# Patient Record
Sex: Male | Born: 1980 | Hispanic: Yes | Marital: Single | Smoking: Never smoker
Health system: Southern US, Community
[De-identification: ages and names within clinical notes are randomized; demographics above are authoritative.]

## PROBLEM LIST (undated history)

## (undated) HISTORY — PX: BLADDER SURGERY: SHX569

---

## 2012-05-23 ENCOUNTER — Emergency Department (HOSPITAL_COMMUNITY)
Admission: EM | Admit: 2012-05-23 | Discharge: 2012-05-23 | Disposition: A | Discharge status: Home / Self Care | Payer: No Typology Code available for payment source | Attending: Emergency Medicine | Admitting: Emergency Medicine

## 2012-05-23 ENCOUNTER — Emergency Department (HOSPITAL_COMMUNITY): Payer: No Typology Code available for payment source

## 2012-05-23 ENCOUNTER — Encounter (HOSPITAL_COMMUNITY): Payer: Self-pay | Admitting: Emergency Medicine

## 2012-05-23 DIAGNOSIS — S0501XA Injury of conjunctiva and corneal abrasion without foreign body, right eye, initial encounter: Secondary | ICD-10-CM

## 2012-05-23 DIAGNOSIS — R51 Headache: Secondary | ICD-10-CM | POA: Insufficient documentation

## 2012-05-23 DIAGNOSIS — S058X9A Other injuries of unspecified eye and orbit, initial encounter: Secondary | ICD-10-CM | POA: Insufficient documentation

## 2012-05-23 DIAGNOSIS — Y92410 Unspecified street and highway as the place of occurrence of the external cause: Secondary | ICD-10-CM | POA: Insufficient documentation

## 2012-05-23 DIAGNOSIS — Y9389 Activity, other specified: Secondary | ICD-10-CM | POA: Insufficient documentation

## 2012-05-23 DIAGNOSIS — Z23 Encounter for immunization: Secondary | ICD-10-CM | POA: Insufficient documentation

## 2012-05-23 MED ORDER — HYDROCODONE-ACETAMINOPHEN 5-325 MG PO TABS
1.0000 | ORAL_TABLET | Freq: Once | ORAL | Status: AC
  Administered 2012-05-23: 1 via ORAL
  Filled 2012-05-23: qty 1

## 2012-05-23 MED ORDER — CYCLOBENZAPRINE HCL 10 MG PO TABS: 10.0000 mg | ORAL_TABLET | Freq: Two times a day (BID) | ORAL | Status: DC | PRN

## 2012-05-23 MED ORDER — TETANUS-DIPHTH-ACELL PERTUSSIS 5-2.5-18.5 LF-MCG/0.5 IM SUSP
0.5000 mL | Freq: Once | INTRAMUSCULAR | Status: AC
  Administered 2012-05-23: 0.5 mL via INTRAMUSCULAR
  Filled 2012-05-23: qty 0.5

## 2012-05-23 MED ORDER — TETRACAINE HCL 0.5 % OP SOLN
1.0000 [drp] | Freq: Once | OPHTHALMIC | Status: DC
  Filled 2012-05-23: qty 2

## 2012-05-23 MED ORDER — ERYTHROMYCIN 5 MG/GM OP OINT: TOPICAL_OINTMENT | OPHTHALMIC | Status: DC

## 2012-05-23 MED ORDER — FLUORESCEIN SODIUM 1 MG OP STRP
1.0000 | ORAL_STRIP | Freq: Once | OPHTHALMIC | Status: DC
  Filled 2012-05-23 (×2): qty 1
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
## 2012-05-23 NOTE — ED Notes (Signed)
Patient transported to CT 
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
## 2015-01-26 ENCOUNTER — Encounter: Payer: Self-pay | Admitting: Emergency Medicine

## 2015-01-26 ENCOUNTER — Emergency Department
Admission: EM | Admit: 2015-01-26 | Discharge: 2015-01-26 | Disposition: A | Discharge status: Home / Self Care | Payer: No Typology Code available for payment source | Attending: Emergency Medicine | Admitting: Emergency Medicine

## 2015-01-26 ENCOUNTER — Emergency Department: Payer: No Typology Code available for payment source

## 2015-01-26 DIAGNOSIS — Y998 Other external cause status: Secondary | ICD-10-CM | POA: Insufficient documentation

## 2015-01-26 DIAGNOSIS — Y9389 Activity, other specified: Secondary | ICD-10-CM | POA: Insufficient documentation

## 2015-01-26 DIAGNOSIS — S39012A Strain of muscle, fascia and tendon of lower back, initial encounter: Secondary | ICD-10-CM | POA: Insufficient documentation

## 2015-01-26 DIAGNOSIS — Y92410 Unspecified street and highway as the place of occurrence of the external cause: Secondary | ICD-10-CM | POA: Diagnosis not present

## 2015-01-26 DIAGNOSIS — S7012XA Contusion of left thigh, initial encounter: Secondary | ICD-10-CM | POA: Insufficient documentation

## 2015-01-26 DIAGNOSIS — S161XXA Strain of muscle, fascia and tendon at neck level, initial encounter: Secondary | ICD-10-CM | POA: Insufficient documentation

## 2015-01-26 DIAGNOSIS — S7002XA Contusion of left hip, initial encounter: Secondary | ICD-10-CM | POA: Diagnosis not present

## 2015-01-26 DIAGNOSIS — S199XXA Unspecified injury of neck, initial encounter: Secondary | ICD-10-CM | POA: Diagnosis present

## 2015-01-26 MED ORDER — NAFTIFINE HCL 2 % EX CREA: 1.0000 "application " | TOPICAL_CREAM | Freq: Two times a day (BID) | CUTANEOUS | Status: AC

## 2015-01-26 MED ORDER — CYCLOBENZAPRINE HCL 10 MG PO TABS: 10.0000 mg | ORAL_TABLET | Freq: Three times a day (TID) | ORAL | Status: AC | PRN

## 2015-01-26 NOTE — ED Notes (Signed)
Assess per PA 
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
## 2015-01-26 NOTE — ED Notes (Signed)
Medical interpreter was present for discharge instructions and discharge.

## 2015-01-26 NOTE — Discharge Instructions (Signed)
Distensin cervical (Cervical Sprain) La distensin cervical se produce cuando los tejidos (ligamentos) del cuello se estiran o se rompen. CUIDADOS EN EL HOGAR   Aplique hielo sobre la zona lesionada.  Ponga el hielo en una bolsa plstica.  Colquese una toalla entre la piel y la bolsa de hielo.  Deje el hielo durante 15 - 20 minutos y aplquelo 3 - 4 veces por Futures trader.  Es posible que le hayan indicado el uso de un collarn. Este collarn impide que el cuello se mueva mientras se [place].  No se lo quite excepto que se lo indique el mdico.  Si tiene el cabello largo, mantngalo fuera del collarn.  Consulte a su mdico antes de cambiar la posicin del collarn. Es posible que necesite cambiar la posicin con el tiempo para sentirse ms cmodo.  Si le permiten quitarse el collarn para lavarlo o darse un bao, siga las indicaciones de su mdico acerca de cmo hacerlo con seguridad.  Mantenga el collarn limpio pasando un pao con agua y [place]. Squelo bien. Si el collarn tiene almohadillas removibles, qutelas cada 1-2 das para lavarlas a mano con agua y [place]. Deje que se sequen al aire. Debe secarlas bien antes de volver a colocarlas en el collarn.  No conduzca vehculos mientras [place] el collarn.  Slo tome los medicamentos que le haya indicado su mdico.  Cumpla con las visitas al mdico segn las indicaciones.  Cumpla con las sesiones de fisioterapia, segn las indicaciones.  Ajuste su mesa de trabajo de modo que tenga una buena postura al trabajar.  Evite las posiciones y actividades que [employer] sntomas.  Haga un precalentamiento y elongacin adecuados antes de la [place]. SOLICITE AYUDA SI:  El dolor no se alivia con los [employer].  No puede disminuir las dosis de medicamentos para el dolor luego de un tiempo, segn lo planificado.  Su nivel de actividad no mejora segn lo esperado. SOLICITE AYUDA DE INMEDIATO SI:   Tiene sangrado.  Siente Restaurant manager, fast food.  Tiene alguna reaccin a los medicamentos.  Tiene sntomas nuevos que no puede explicar.  Pierde la sensibilidad (adormecimiento) o no Therapist, nutritional del cuerpo (parlisis).  Siente hormigueo o debilidad en alguna parte del cuerpo.  Los sntomas empeoran. Los sntomas son:  Dolor, sensibilidad, rigidez, inflamacin(hinchazn), o sensacin de ardor en el cuello.  Siente dolor cuando le tocan el cuello.  Dolor en el hombro o la zona superior de la espalda.  Capacidad limitada para mover el cuello.  Dolor de [place].  Mareos.  Debilidad, falta de sensibilidad o sensacin hormigueos en las manos o los brazos.  Espasmos musculares.  Dificultades para tragar o comer. ASEGRESE DE QUE:   Comprende estas instrucciones.  Controlar su afeccin.  Recibir ayuda de inmediato si no mejora o si empeora.   Esta informacin no tiene Theme park manager el consejo del mdico. Asegrese de hacerle al mdico cualquier pregunta que tenga.   Document Released: 01/09/2011 Document Revised: 09/22/2012 Elsevier Interactive Patient Education 2016 [employer].  Contusin en la cresta ilaca  (Iliac Crest Contusion) Una contusin en la cresta ilaca es un hematoma profundo en el (hueso de la cadera). Las contusiones son el resultado de una lesin que causa sangrado debajo de la piel. La zona de la contusin puede ponerse [place], [place] o [place]. Las lesiones menores no causan Engineer, mining, Biomedical engineer las ms graves pueden presentar dolor e inflamacin durante un par de semanas.  CAUSAS  Una contusin en la cresta ilaca  es consecuencia de un golpe en la parte superior del hueso de la cadera. Las lesiones con frecuencia son consecuencia de los deportes de [place].  SNTOMAS   Hinchazn y enrojecimiento en la zona de la cadera.  Hematoma en la zona.  Sensibilidad o dolor. DIAGNSTICO  El diagnstico puede hacerse realizando una historia clnica y un examen fsico. Puede ser  necesario que le tomen una radiografa del hueso de la cadera para diagnosticar si hay algn hueso roto (fractura).  TRATAMIENTO  El mejor tratamiento para la contusin en la cresta ilaca suele ser el reposo, la aplicacin de hielo, la elevacin de la zona y la aplicacin de compresas fras en la zona de la lesin. Para controlar el dolor tambin podrn recetarle medicamentos de [place]. Se pueden usar muletas si siente mucho dolor al caminar. Algunas personas necesitan fisioterapia para ayudar con la amplitud de movimientos y elongacin.  INSTRUCCIONES PARA EL CUIDADO EN EL HOGAR   Aplique hielo sobre la zona lesionada.  Ponga el hielo en una bolsa plstica.  Colquese una toalla entre la piel y la bolsa de hielo.  Deje el hielo durante 15 a 20 minutos, 3 a 4 veces por da.  Slo tome medicamentos de venta libre o recetados para Primary school teacher, las molestias o bajar la fiebre segn las indicaciones de su mdico. El mdico podr indicarle que evite tomar antiinflamatorios (aspirina, ibuprofeno y naproxeno) durante 48 horas ya que estos medicamentos pueden aumentar los hematomas.  Mantenga la pierna derecha (extendida) siempre que le sea posible.  Camine o muvase en la medida que el dolor se lo permita o segn se lo hayan indicado. Use muletas si el mdico se lo indica.  Aplique un vendaje de compresin segn le haya indicado su mdico. Puede retirar los vendajes para dormir, darse Neomia Dear ducha y baarse. SOLICITE ATENCIN MDICA DE INMEDIATO SI:   El hematoma o la hinchazn aumentan.  Siente dolor que [place].  La hinchazn o el dolor no se [employer].  Los pies se enfran. ASEGRESE DE QUE:   Comprende estas instrucciones.  Controlar su enfermedad.  Solicitar ayuda de inmediato si no mejora o si empeora.   Esta informacin no tiene Theme park manager el consejo del mdico. Asegrese de hacerle al mdico cualquier pregunta que tenga.   Document  Released: 01/20/2005 Document Revised: 04/14/2011 Elsevier Interactive Patient Education 2016 [employer].  Colisin con un vehculo de motor (Tourist information centre manager) Despus de sufrir un accidente automovilstico, es normal tener diversos hematomas y [employer]. Generalmente, estas molestias son peores durante las primeras 24 horas. En las primeras horas, probablemente sienta mayor entumecimiento y Engineer, mining. Tambin puede sentirse peor al despertarse la maana posterior a la colisin. A partir de all, debera comenzar a Associate Professor. La velocidad con que se mejora generalmente depende de la gravedad de la colisin y la cantidad, [place] y Firefighter de las lesiones. INSTRUCCIONES PARA EL CUIDADO EN EL HOGAR   Aplique hielo sobre la zona lesionada.  Ponga el hielo en una bolsa plstica.  Colquese una toalla entre la piel y la bolsa de hielo.  Deje el hielo durante 15 a , 3 a 4veces por da, o segn las indicaciones del mdico.  Albesa Seen suficiente lquido para mantener la orina clara o de color amarillo plido. No beba alcohol.  Tome una ducha o un bao tibio una o dos veces al da. Esto aumentar el flujo de [employer] msculos doloridos.  Puede retomar  sus actividades normales cuando se lo indique el mdico. Tenga cuidado al levantar objetos, ya que puede agravar el dolor en el cuello o en la espalda.  Utilice los medicamentos de venta libre o recetados para Primary school teacher, el malestar o la fiebre, segn se lo indique el mdico. No tome aspirina. Puede aumentar los hematomas o la hemorragia. SOLICITE ATENCIN MDICA DE INMEDIATO SI:  Tiene entumecimiento, hormigueo o debilidad en los brazos o las piernas.  Tiene dolor de cabeza intenso que no mejora con medicamentos.  Siente un dolor intenso en el cuello, especialmente con la palpacin en el centro de la espalda o el cuello.  Disminuye su control de la vejiga o los intestinos.  Aumenta el dolor en  cualquier parte del cuerpo.  Le falta el aire, tiene sensacin de desvanecimiento, mareos o [employer].  Siente dolor en el pecho.  Tiene malestar estomacal (nuseas), vmitos o sudoracin.  Cada vez siente ms dolor abdominal.  Anola Gurney sangre en la orina, en la materia fecal o en el vmito.  Siente dolor en los hombros (en la zona del cinturn de seguridad).  Siente que los sntomas empeoran. ASEGRESE DE QUE:   Comprende estas instrucciones.  Controlar su afeccin.  Recibir ayuda de inmediato si no mejora o si empeora.   Esta informacin no tiene Theme park manager el consejo del mdico. Asegrese de hacerle al mdico cualquier pregunta que tenga.   Document Released: 10/30/2004 Document Revised: 02/10/2014 Elsevier Interactive Patient Education 2016 [employer].  Distensin lumbosacra (Lumbosacral Strain) La distensin lumbosacra es una distensin de cualquiera de las partes que componen las vrtebras lumbosacras. Las vrtebras lumbosacras son los huesos que conforman el tercio inferior de la columna vertebral. Estas vrtebras estn sostenidas por msculos y un resistente tejido fibroso (ligamentos).  CAUSAS  Un golpe repentino en la espalda puede provocar una distensin lumbosacra. Adems, cualquier tipo de movimiento que cause una elongacin excesiva de los msculos de la zona lumbar puede provocar este tipo de distensin. Esto se ve normalmente en las personas que se esfuerzan demasiado, se caen, levantan objetos pesados, se agachan o estn en cuclillas con regularidad. FACTORES DE RIESGO  Trabajo agotador.  Participar en deportes en los cuales se deba empujar o tirar y que requieren de un giro repentino de la espalda (tenis, golf, bisbol).  Levantar peso.  Curvatura excesiva de la zona lumbar.  Pelvis hacia adelante.  Espalda o msculos abdominales dbiles, o ambos.  Tendones isquiotibiales tensos. SIGNOS Y SNTOMAS  La distensin lumbosacra puede provocar  dolor en la zona de la lesin o un dolor que baja (se extiende) hasta la pierna.  DIAGNSTICO Con frecuencia, el mdico puede diagnosticar una distensin [employer] un examen fsico. En algunos casos, es posible que deba realizarse pruebas, como una [place].  TRATAMIENTO  El tratamiento para la lesin lumbar depende de muchos factores que el mdico Paediatric nurse. Sin embargo, la [employer] de los tratamientos incluye el uso de antiinflamatorios. INSTRUCCIONES PARA EL CUIDADO EN EL HOGAR   Evite actividades fsicas difciles (tenis, raquetbol, esqu acutico) si no tiene un buen estado fsico para practicarlas. Esto puede Radio producer.  Si tiene un problema en la espalda, evite los deportes que requieren de movimientos corporales bruscos. La natacin y las caminatas son las actividades ms seguras.  Mantenga una buena postura.  Mantenga un peso saludable.  En el caso de episodios agudos, puede colocar hielo en la zona lesionada.  Ponga el hielo en una bolsa plstica.  Coloque  una toalla entre la piel y la bolsa de hielo.  Deje el hielo durante 20 minutos, 2 a 3 veces por da.  Cuando la zona lumbar comience a sanar, es posible que le recomienden ejercicios de elongacin y fortalecimiento. SOLICITE ATENCIN MDICA SI:  El dolor de Oncologistespalda empeora.  Tiene un dolor de espalda intenso que no mejora con medicamentos. SOLICITE ATENCIN MDICA DE INMEDIATO SI:   Siente entumecimiento, hormigueo, debilidad o problemas con el uso de los brazos o las piernas.  Nota cambios en el control de la vejiga o el intestino.  Siente un aumento del Cytogeneticistdolor en cualquier parte del cuerpo, incluido el vientre (abdomen).  Nota que le falta el aire, se siente mareado o se desmaya.  Tiene Programme researcher, broadcasting/film/videomalestar estomacal (nuseas), vomita o comienza a sudar.  Nota un cambio de color en los dedos del pie o las piernas, o los pies se ponen muy fros. ASEGRESE DE QUE:   Comprende estas  instrucciones.  Controlar su afeccin.  Recibir ayuda de inmediato si no mejora o si empeora.   Esta informacin no tiene Theme park managercomo fin reemplazar el consejo del mdico. Asegrese de hacerle al mdico cualquier pregunta que tenga.   Document Released: 10/30/2004 Document Revised: 01/25/2013 Elsevier Interactive Patient Education [employer]2016 Elsevier Inc.

## 2015-02-21 ENCOUNTER — Other Ambulatory Visit: Payer: Self-pay | Admitting: Orthopedic Surgery

## 2015-02-21 DIAGNOSIS — M532X8 Spinal instabilities, sacral and sacrococcygeal region: Secondary | ICD-10-CM

## 2015-03-08 ENCOUNTER — Other Ambulatory Visit: Payer: Self-pay

## 2015-03-08 ENCOUNTER — Ambulatory Visit
Admission: RE | Admit: 2015-03-08 | Discharge: 2015-03-08 | Disposition: A | Discharge status: Home / Self Care | Payer: No Typology Code available for payment source | Source: Ambulatory Visit | Attending: Orthopedic Surgery | Admitting: Orthopedic Surgery

## 2015-03-08 DIAGNOSIS — M532X8 Spinal instabilities, sacral and sacrococcygeal region: Secondary | ICD-10-CM

## 2016-11-28 IMAGING — CT CT BIOPSY
1 of 3 series · 12 of 32 positions shown, 18 images · non-contrast
Comparison: none

CLINICAL DATA: Low back pain extending to the posterior left pelvis
and left lower extremity. An anesthetic injection is requested for
diagnostic purposes.

[Series 2: si joint injection · axial · 0.66mm/px · z∈[+879,+918]mm · 12 of 17 slices shown, 18 images]
[im 2/17  soft-tissue]
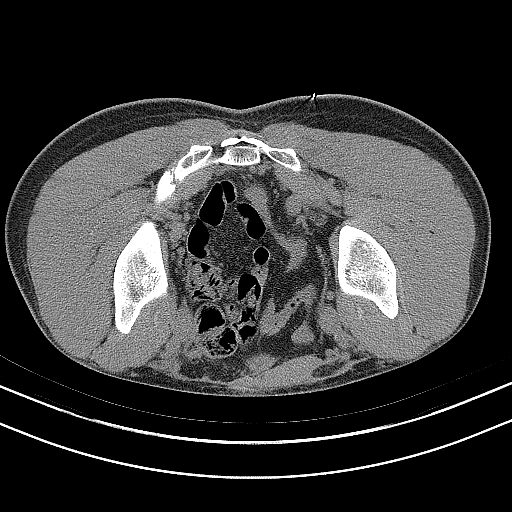
[im 2/17  bone]
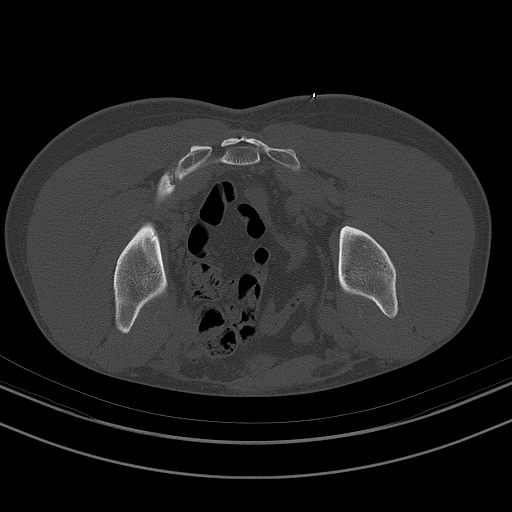
[im 3/17  soft-tissue]
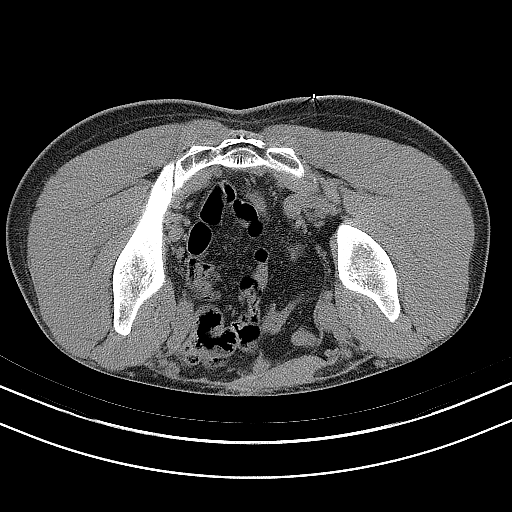
[im 4/17  soft-tissue]
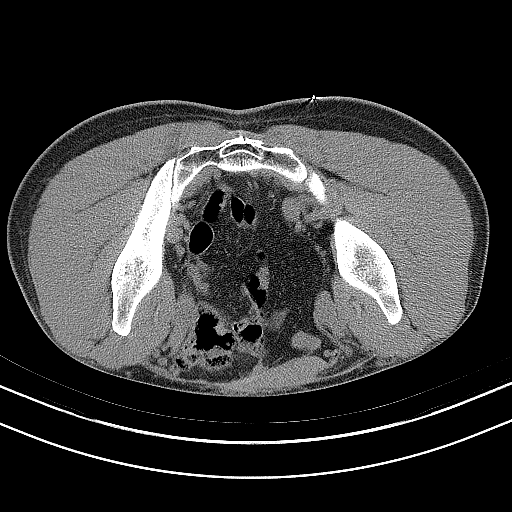
[im 5/17  soft-tissue]
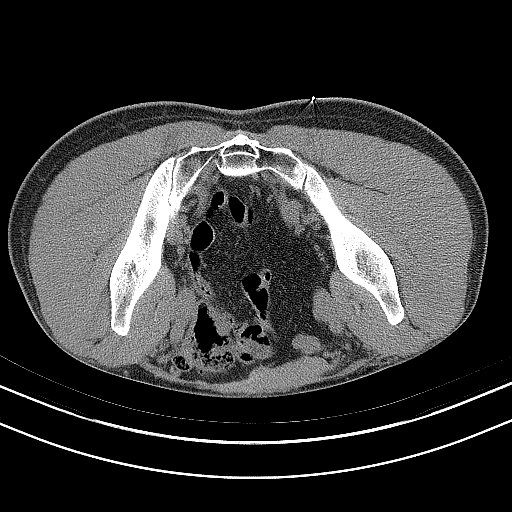
[im 7/17  soft-tissue]
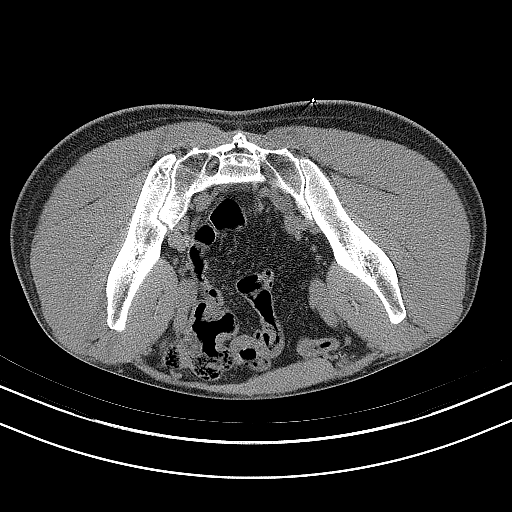
[im 8/17  soft-tissue]
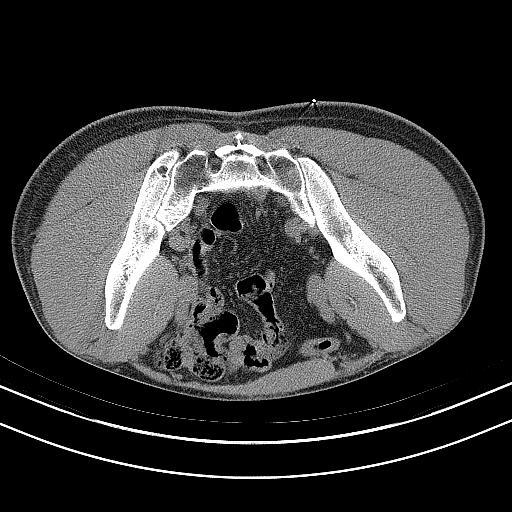
[im 9/17  soft-tissue]
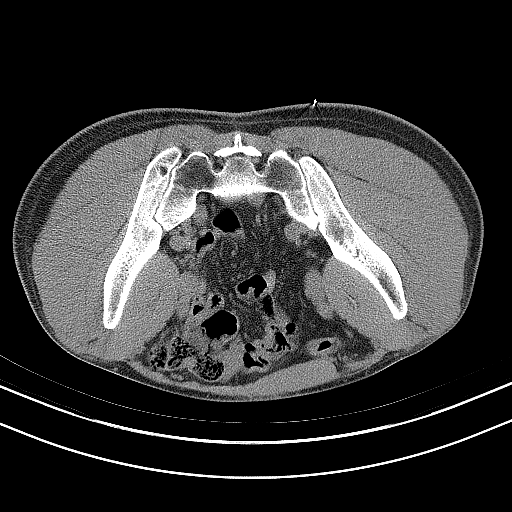
[im 10/17  soft-tissue]
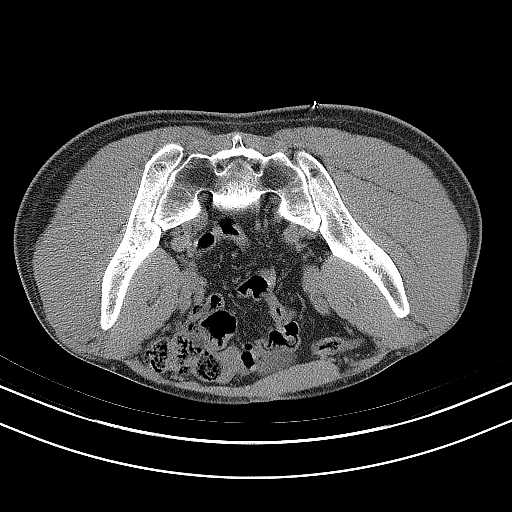
[im 12/17  soft-tissue]
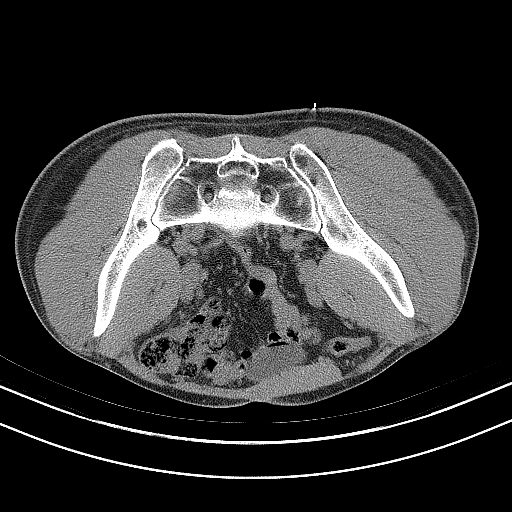
[im 12/17  lung]
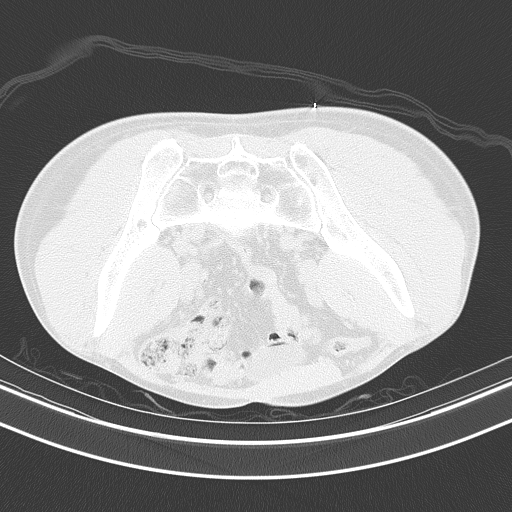
[im 12/17  bone]
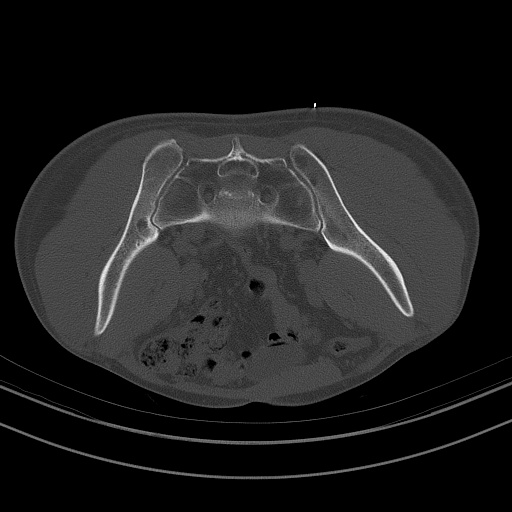
[im 13/17  soft-tissue]
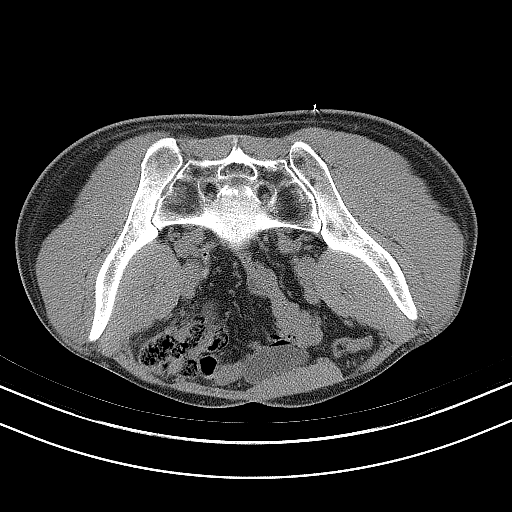
[im 13/17  lung]
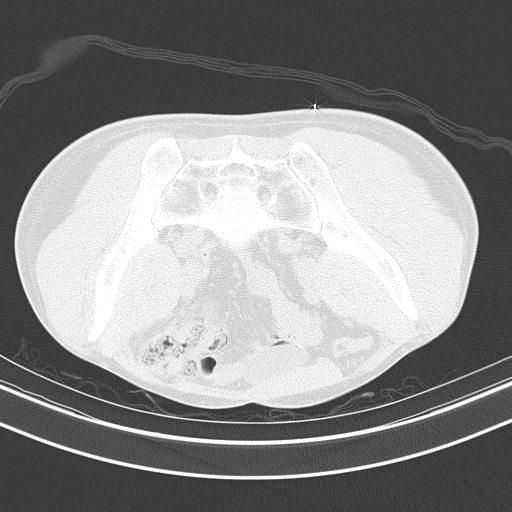
[im 14/17  soft-tissue]
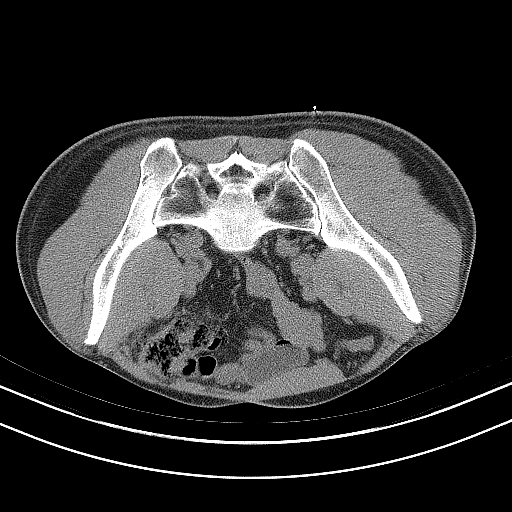
[im 14/17  lung]
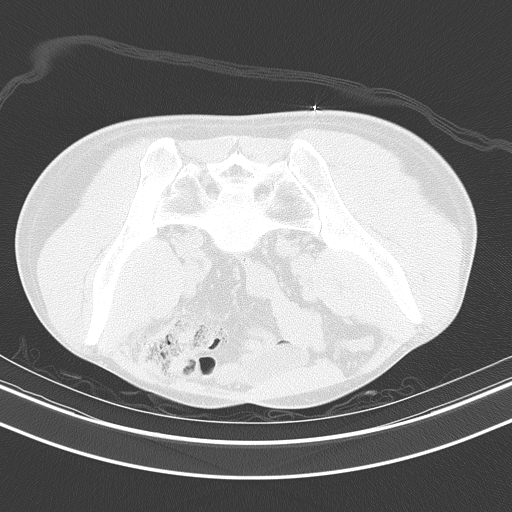
[im 15/17  soft-tissue]
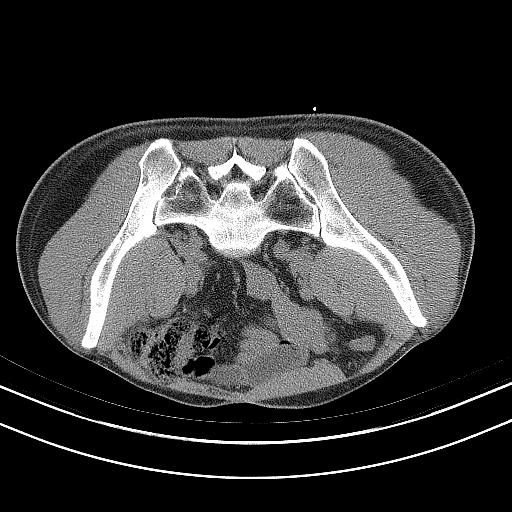
[im 15/17  lung]
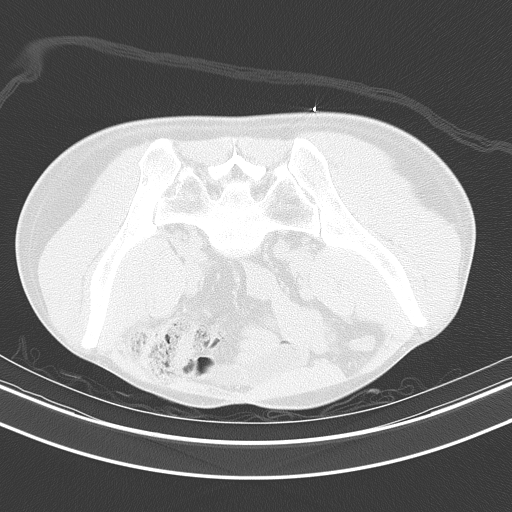

[12 of 32 positions shown; findings below may reference images not displayed]

EXAM:
Left CT GUIDED SI JOINT INJECTION



After local anesthesia with 1% lidocaine without epinephrine and
subsequent deep anesthesia, a 22 gauge spinal needle was advanced
into the left SI joint under intermittent CT guidance.

Once the needle was in satisfactory position, representative image
was captured with the needle demonstrated in the sacroiliac joint.
Subsequently, 3 mL 1% lidocaine was injected into the left SI joint.
Needles removed and a sterile dressing applied.

Bilateral sub cm well-defined hypodense lesions are present within
the iliac bones. These do not transgress the cortex. There is no
osseous destruction. The sclerotic lesions are present.

No complications were observed.
IMPRESSION: IMPRESSION

Successful CT-guided left SI joint injection with anesthetic only.

Bilateral sub cm hypodense lesions in the iliac bones compatible
with benign lesions such as hemangiomas. No follow-up is necessary.

## 2021-06-01 ENCOUNTER — Encounter: Payer: Self-pay | Admitting: Emergency Medicine

## 2021-06-01 ENCOUNTER — Emergency Department
Admission: EM | Admit: 2021-06-01 | Discharge: 2021-06-01 | Disposition: A | Discharge status: Home / Self Care | Payer: Self-pay | Attending: Emergency Medicine | Admitting: Emergency Medicine

## 2021-06-01 ENCOUNTER — Other Ambulatory Visit: Payer: Self-pay

## 2021-06-01 ENCOUNTER — Emergency Department: Payer: Self-pay

## 2021-06-01 DIAGNOSIS — R1032 Left lower quadrant pain: Secondary | ICD-10-CM | POA: Insufficient documentation

## 2021-06-01 DIAGNOSIS — Z85028 Personal history of other malignant neoplasm of stomach: Secondary | ICD-10-CM | POA: Insufficient documentation

## 2021-06-01 DIAGNOSIS — R110 Nausea: Secondary | ICD-10-CM | POA: Insufficient documentation

## 2021-06-01 DIAGNOSIS — R1013 Epigastric pain: Secondary | ICD-10-CM | POA: Insufficient documentation

## 2021-06-01 DIAGNOSIS — Z85038 Personal history of other malignant neoplasm of large intestine: Secondary | ICD-10-CM | POA: Insufficient documentation

## 2021-06-01 LAB — COMPREHENSIVE METABOLIC PANEL
ALT: 18 U/L (ref 0–44)
AST: 25 U/L (ref 15–41)
Albumin: 4.5 g/dL (ref 3.5–5.0)
Alkaline Phosphatase: 54 U/L (ref 38–126)
Anion gap: 7 (ref 5–15)
BUN: 16 mg/dL (ref 6–20)
CO2: 31 mmol/L (ref 22–32)
Calcium: 9.5 mg/dL (ref 8.9–10.3)
Chloride: 101 mmol/L (ref 98–111)
Creatinine, Ser: 0.96 mg/dL (ref 0.61–1.24)
GFR, Estimated: >60 mL/min (ref >60)
Glucose, Bld: 103 mg/dL — ABNORMAL HIGH (ref 70–99)
Potassium: 3.8 mmol/L (ref 3.5–5.1)
Sodium: 139 mmol/L (ref 135–145)
Total Bilirubin: 1.6 mg/dL — ABNORMAL HIGH (ref 0.3–1.2)
Total Protein: 7.7 g/dL (ref 6.5–8.1)

## 2021-06-01 LAB — CBC
HCT: 45.1 % (ref 39.0–52.0)
Hemoglobin: 15.1 g/dL (ref 13.0–17.0)
MCH: 30.1 pg (ref 26.0–34.0)
MCHC: 33.5 g/dL (ref 30.0–36.0)
MCV: 90 fL (ref 80.0–100.0)
Platelets: 255 10*3/uL (ref 150–400)
RBC: 5.01 MIL/uL (ref 4.22–5.81)
RDW: 12.6 % (ref 11.5–15.5)
WBC: 4.6 10*3/uL (ref 4.0–10.5)
nRBC: 0 % (ref 0.0–0.2)

## 2021-06-01 LAB — URINALYSIS, ROUTINE W REFLEX MICROSCOPIC
Bilirubin Urine: NEGATIVE
Glucose, UA: NEGATIVE mg/dL
Hgb urine dipstick: NEGATIVE
Ketones, ur: NEGATIVE mg/dL
Leukocytes,Ua: NEGATIVE
Nitrite: NEGATIVE
Protein, ur: NEGATIVE mg/dL
Specific Gravity, Urine: 1.015 (ref 1.005–1.030)
pH: 6 (ref 5.0–8.0)

## 2021-06-01 LAB — LIPASE, BLOOD: Lipase: 35 U/L (ref 11–51)

## 2021-06-01 MED ORDER — OMEPRAZOLE MAGNESIUM 20 MG PO TBEC: 20.0000 mg | DELAYED_RELEASE_TABLET | Freq: Every day | ORAL | 1 refills | Status: AC

## 2021-06-01 MED ORDER — SUCRALFATE 1 G PO TABS: 1.0000 g | ORAL_TABLET | Freq: Three times a day (TID) | ORAL | 0 refills | Status: AC

## 2021-06-01 MED ORDER — IOHEXOL 300 MG/ML  SOLN
80.0000 mL | Freq: Once | INTRAMUSCULAR | Status: AC | PRN
  Administered 2021-06-01: 80 mL via INTRAVENOUS
  Filled 2021-06-01: qty 80

## 2021-06-01 NOTE — ED Triage Notes (Signed)
Pt via POV from home. Pt c/o generalized abd pain and lower back pain that started 2 months ago. Pt states it is all over, including lower and epigastric. Pt endorses nausea for 2 weeks. Denies VD. Denies fever. Pain is worse when he eating. Denies urinary symptoms. Pt is A&Ox4 and NAD ? ?Spanish interpreter needed  ?

## 2021-06-01 NOTE — ED Notes (Signed)
See triage note  presents with generalized abd pain  states pain started 2 months ago  states pain is mainly to lower abd and back  unsure of fever   afebrile on arrival ?

## 2021-06-01 NOTE — ED Provider Notes (Signed)
? ?[facility] ?Provider Note ? ? Event Date/Time  ? First MD Initiated Contact with Patient 06/01/21 1427   ?  (approximate) ?History  ?Abdominal Pain ? ?HPI ?Jeremy Hodge is a 41 y.o. male with stated past medical history of right inguinal hernia repair in 2017 who presents for abdominal pain.  Patient describes intermittent midepigastric and left lower quadrant abdominal pain that is somewhat related to p.o. intake.  Patient states that after eating he normally gets a burning sensation to the mid epigastric region that then starts a left lower quadrant abdominal pain that radiates around to the suprapubic region as well as around to his lower back.  Patient states that he also has similar symptoms when he feels like his bladder is distended.  Patient states that his pain does not change in relation to what food he eats, what time of day it is, or what medications he is taking at the time including attempted course of Bentyl which did not improve his symptoms.  Patient does state that he has a significant medical history of gastric and colon cancers which he is worried that he needs an endoscopy.  Patient currently denies any vision changes, tinnitus, difficulty speaking, facial droop, sore throat, chest pain, shortness of breath, nausea/vomiting/diarrhea, dysuria, or weakness/numbness/paresthesias in any extremity ?Physical Exam  ?Triage Vital Signs: ?ED Triage Vitals  ?Enc Vitals Group  ?   BP 06/01/21 1402 (!) 147/92  ?   Pulse Rate 06/01/21 1402 69  ?   Resp 06/01/21 1402 18  ?   Temp 06/01/21 1402 98.4 ?F (36.9 ?C)  ?   Temp Source 06/01/21 1402 Oral  ?   SpO2 06/01/21 1402 97 %  ?   Weight 06/01/21 1405 160 lb (72.6 kg)  ?   Height 06/01/21 1405 5\' 9"  (1.753 m)  ?   Head Circumference --   ?   Peak Flow --   ?   Pain Score 06/01/21 1403 5  ?   Pain Loc --   ?   Pain Edu? --   ?   Excl. in GC? --   ? ?Most recent vital signs: ?Vitals:  ? 06/01/21 1402  ?BP: (!) 147/92  ?Pulse: 69  ?Resp:  18  ?Temp: 98.4 ?F (36.9 ?C)  ?SpO2: 97%  ? ?General: Awake, oriented x4. ?CV:  Good peripheral perfusion.  ?Resp:  Normal effort.  ?Abd:  No distention.  Mild left lower quadrant tenderness to palpation without referred pain to the back.  No CVA tenderness to percussion.  Inguinal region without obvious deformity and no obvious masses on straining at either inguinal region ?Other:  Middle-aged Hispanic male laying in bed in no distress ?ED Results / Procedures / Treatments  ?Labs ?(all labs ordered are listed, but only abnormal results are displayed) ?Labs Reviewed  ?COMPREHENSIVE METABOLIC PANEL - Abnormal; Notable for the following components:  ?    Result Value  ? Glucose, Bld 103 (*)   ? Total Bilirubin 1.6 (*)   ? All other components within normal limits  ?URINALYSIS, ROUTINE W REFLEX MICROSCOPIC - Abnormal; Notable for the following components:  ? Color, Urine YELLOW (*)   ? APPearance CLEAR (*)   ? All other components within normal limits  ?LIPASE, BLOOD  ?CBC  ? ?EKG ?ED ECG REPORT ?I, 06/03/21, the attending physician, personally viewed and interpreted this ECG. ?Date: 06/01/2021 ?EKG Time: 1404 ?Rate: 77 ?Rhythm: normal sinus rhythm ?QRS Axis: normal ?Intervals: normal ?ST/T  Wave abnormalities: normal ?Narrative Interpretation: no evidence of acute ischemia ?RADIOLOGY ?ED MD interpretation: CT of the abdomen and pelvis with IV contrast interpreted by me shows postoperative changes in the suprapubic region likely related to the hernia raphe with thinning of the right rectus muscle close to the urinary bladder. ?-Agree with radiology assessment ?Official radiology report(s): ?CT Abdomen Pelvis W Contrast ? ?Result Date: 06/01/2021 ?CLINICAL DATA:  A 41 year old male presents with history of abdominal pain, pain also in the lower back. Symptoms began 2 months ago. EXAM: CT ABDOMEN AND PELVIS WITH CONTRAST TECHNIQUE: Multidetector CT imaging of the abdomen and pelvis was performed using the standard  protocol following bolus administration of intravenous contrast. RADIATION DOSE REDUCTION: This exam was performed according to the departmental dose-optimization program which includes automated exposure control, adjustment of the mA and/or kV according to patient size and/or use of iterative reconstruction technique. CONTRAST:  50mL OMNIPAQUE IOHEXOL 300 MG/ML  SOLN COMPARISON:  Limited CT images for joint injection from 2017. FINDINGS: Lower chest: Lung bases without signs of effusion or evidence of consolidation. Granuloma in the LEFT lung base. Hepatobiliary: No focal, suspicious hepatic lesion. No pericholecystic stranding. No biliary duct dilation. Portal vein is patent. Pancreas: Normal, without mass, inflammation or ductal dilatation. Spleen: Spleen is normal. Adrenals/Urinary Tract: Adrenal glands are unremarkable. Symmetric renal enhancement. No sign of hydronephrosis. No suspicious renal lesion or perinephric stranding. .Close apposition of the urinary bladder to the LEFT abdominal wall is similar to imaging dating back to 2017. No perivesical stranding. Thinning of RIGHT rectus muscle and disturbance in adjacent soft tissues about the urinary bladder compatible with prior surgery. Stomach/Bowel: Moderately distended stomach filled with ingested contents. No signs of adjacent stranding. Small bowel without signs of obstruction or inflammation. The appendix is normal. Stool throughout the colon. Vascular/Lymphatic: Aorta with smooth contours. IVC with smooth contours. No aneurysmal dilation of the abdominal aorta. There is no gastrohepatic or hepatoduodenal ligament lymphadenopathy. No retroperitoneal or mesenteric lymphadenopathy. No pelvic sidewall lymphadenopathy. Reproductive: Unremarkable by CT. Other: No ascites.  No free intra-abdominal air. Musculoskeletal: No acute bone finding. No destructive bone process. Spinal degenerative changes. Degenerative changes are mild. IMPRESSION: 1. No acute  findings in the abdomen or in the pelvis. 2. Postoperative changes in the suprapubic region perhaps related to prior herniorrhaphy with thinning of RIGHT rectus muscle and close apposition of the urinary bladder to the under surface of the LEFT rectus muscle. No surrounding stranding beyond what was present in 2017. Electronically Signed   By: Donzetta Kohut M.D.   On: 06/01/2021 15:42   ?PROCEDURES: ?Critical Care performed: No ?.1-3 Lead EKG Interpretation ?Performed by: Merwyn Katos, MD ?Authorized by: Merwyn Katos, MD  ? ?  Interpretation: normal   ?  ECG rate:  70 ?  ECG rate assessment: normal   ?  Rhythm: sinus rhythm   ?  Ectopy: none   ?  Conduction: normal   ?MEDICATIONS ORDERED IN ED: ?Medications  ?iohexol (OMNIPAQUE) 300 MG/ML solution 80 mL (80 mLs Intravenous Contrast Given 06/01/21 1516)  ? ?IMPRESSION / MDM / ASSESSMENT AND PLAN / ED COURSE  ?I reviewed the triage vital signs and the nursing notes. ?             ?               ?The patient is on the cardiac monitor to evaluate for evidence of arrhythmia and/or significant heart rate changes. ?Patient presents for abdominal pain.  Differential diagnosis includes appendicitis, abdominal aortic aneurysm, surgical biliary disease, pancreatitis, SBO, mesenteric ischemia, serious intra-abdominal bacterial illness, genital torsion. ?Doubt atypical ACS. ?Laboratory evaluation significant for mildly elevated total bilirubin at 1.6 and CT of the abdomen and pelvis showing mild rectus muscle thinning around previous hernia repair site and abutting the bladder. ?Based on history, physical exam, radiologic/laboratory evaluation, there is no red flag results or symptomatology requiring emergent intervention or need for admission at this time ?Pt tolerating PO. ?Rx: Omeprazole, Glucophage, GI referral ?Disposition: Patient will be discharged with strict return precautions and follow up with primary MD within 12-24 hours for further evaluation. ?Patient  understands that this still may have an early presentation of an emergent medical condition such as appendicitis that will require a recheck. ?  ?FINAL CLINICAL IMPRESSION(S) / ED DIAGNOSES  ? ?Final diagnoses:  ?

## 2021-06-09 ENCOUNTER — Emergency Department
Admission: EM | Admit: 2021-06-09 | Discharge: 2021-06-09 | Disposition: A | Discharge status: Home / Self Care | Payer: Self-pay | Attending: Student in an Organized Health Care Education/Training Program | Admitting: Student in an Organized Health Care Education/Training Program

## 2021-06-09 ENCOUNTER — Other Ambulatory Visit: Payer: Self-pay

## 2021-06-09 DIAGNOSIS — R1013 Epigastric pain: Secondary | ICD-10-CM | POA: Insufficient documentation

## 2021-06-09 LAB — COMPREHENSIVE METABOLIC PANEL
ALT: 21 U/L (ref 0–44)
AST: 20 U/L (ref 15–41)
Albumin: 4.5 g/dL (ref 3.5–5.0)
Alkaline Phosphatase: 56 U/L (ref 38–126)
Anion gap: 5 (ref 5–15)
BUN: 13 mg/dL (ref 6–20)
CO2: 28 mmol/L (ref 22–32)
Calcium: 9.4 mg/dL (ref 8.9–10.3)
Chloride: 104 mmol/L (ref 98–111)
Creatinine, Ser: 0.75 mg/dL (ref 0.61–1.24)
GFR, Estimated: >60 mL/min (ref >60)
Glucose, Bld: 108 mg/dL — ABNORMAL HIGH (ref 70–99)
Potassium: 4.4 mmol/L (ref 3.5–5.1)
Sodium: 137 mmol/L (ref 135–145)
Total Bilirubin: 1.3 mg/dL — ABNORMAL HIGH (ref 0.3–1.2)
Total Protein: 7.6 g/dL (ref 6.5–8.1)

## 2021-06-09 LAB — URINALYSIS, ROUTINE W REFLEX MICROSCOPIC
Bilirubin Urine: NEGATIVE
Glucose, UA: NEGATIVE mg/dL
Hgb urine dipstick: NEGATIVE
Ketones, ur: NEGATIVE mg/dL
Leukocytes,Ua: NEGATIVE
Nitrite: NEGATIVE
Protein, ur: NEGATIVE mg/dL
Specific Gravity, Urine: 1.014 (ref 1.005–1.030)
pH: 7 (ref 5.0–8.0)

## 2021-06-09 LAB — TYPE AND SCREEN
ABO/RH(D): A POS
Antibody Screen: NEGATIVE

## 2021-06-09 LAB — LIPASE, BLOOD: Lipase: 34 U/L (ref 11–51)

## 2021-06-09 LAB — CBC
HCT: 44.4 % (ref 39.0–52.0)
Hemoglobin: 15 g/dL (ref 13.0–17.0)
MCH: 30.9 pg (ref 26.0–34.0)
MCHC: 33.8 g/dL (ref 30.0–36.0)
MCV: 91.4 fL (ref 80.0–100.0)
Platelets: 243 10*3/uL (ref 150–400)
RBC: 4.86 MIL/uL (ref 4.22–5.81)
RDW: 12.6 % (ref 11.5–15.5)
WBC: 5.3 10*3/uL (ref 4.0–10.5)
nRBC: 0 % (ref 0.0–0.2)

## 2021-06-09 MED ORDER — ALUM & MAG HYDROXIDE-SIMETH 200-200-20 MG/5ML PO SUSP
30.0000 mL | Freq: Once | ORAL | Status: AC
  Administered 2021-06-09: 30 mL via ORAL
  Filled 2021-06-09: qty 30

## 2021-06-09 MED ORDER — DICYCLOMINE HCL 10 MG PO CAPS: 10.0000 mg | ORAL_CAPSULE | Freq: Three times a day (TID) | ORAL | 0 refills | Status: AC | PRN

## 2021-06-09 MED ORDER — DICYCLOMINE HCL 10 MG PO CAPS
10.0000 mg | ORAL_CAPSULE | Freq: Once | ORAL | Status: AC
  Administered 2021-06-09: 10 mg via ORAL
  Filled 2021-06-09: qty 1

## 2021-06-09 MED ORDER — LIDOCAINE VISCOUS HCL 2 % MT SOLN
15.0000 mL | Freq: Once | OROMUCOSAL | Status: AC
  Administered 2021-06-09: 15 mL via ORAL
  Filled 2021-06-09: qty 15

## 2021-06-09 NOTE — ED Notes (Addendum)
See triage note. Pt complains of upper abdominal pain and lower abdominal pain in "intestinal area". No rectal bleeding seen at this time.  ?

## 2021-06-09 NOTE — ED Triage Notes (Signed)
Pt via POV from home. Pt c/o generalized abd pain for the past 2 months. States he was seen 2 weeks ago and they did not help. Pt c/o nausea and chills also. Pt c/o bright red rectal bleeding that started on Friday. Pt is A&OX4 and NAD ? ?Spanish interpreter needed. ?

## 2021-06-09 NOTE — ED Provider Notes (Signed)
? ?[facility] ?Provider Note ? ? ? Event Date/Time  ? First MD Initiated Contact with Patient 06/09/21 1816   ?  (approximate) ? ? ?History  ? ?Abdominal Pain ? ? ?HPI ? ?Jeremy Hodge is a 41 y.o. male with history of gastritis and reflux presents to the ER for evaluation of persistent epigastric discomfort.  States that on Friday he moved his bowels and had a streak of red blood in them.  Does have a history of hemorrhoids.  Has not had any bleeding today.  States he has been given referral to [facility] speak with him tomorrow about follow-up.  He denies any lower abdominal pain only epigastric pain no nausea or vomiting. ?  ? ? ?Physical Exam  ? ?Triage Vital Signs: ?ED Triage Vitals  ?Enc Vitals Group  ?   BP 06/09/21 1743 (!) 146/104  ?   Pulse Rate 06/09/21 1743 74  ?   Resp 06/09/21 1743 18  ?   Temp 06/09/21 1743 98.3 ?F (36.8 ?C)  ?   Temp Source 06/09/21 1743 Oral  ?   SpO2 06/09/21 1743 98 %  ?   Weight 06/09/21 1740 163 lb (73.9 kg)  ?   Height 06/09/21 1740 5\' 9"  (1.753 m)  ?   Head Circumference --   ?   Peak Flow --   ?   Pain Score 06/09/21 1740 8  ?   Pain Loc --   ?   Pain Edu? --   ?   Excl. in GC? --   ? ? ?Most recent vital signs: ?Vitals:  ? 06/09/21 1743  ?BP: (!) 146/104  ?Pulse: 74  ?Resp: 18  ?Temp: 98.3 ?F (36.8 ?C)  ?SpO2: 98%  ? ? ? ?Constitutional: Alert  ?Eyes: Conjunctivae are normal.  ?Head: Atraumatic. ?Nose: No congestion/rhinnorhea. ?Mouth/Throat: Mucous membranes are moist.   ?Neck: Painless ROM.  ?Cardiovascular:   Good peripheral circulation. ?Respiratory: Normal respiratory effort.  No retractions.  ?Gastrointestinal: Soft and nontender in all 4 quadrants.  No blood on DRE.  No mass no hemorrhoid appreciated ?Musculoskeletal:  no deformity ?Neurologic:  MAE spontaneously. No gross focal neurologic deficits are appreciated.  ?Skin:  Skin is warm, dry and intact. No rash noted. ?Psychiatric: Mood and affect are normal. Speech and behavior are normal. ? ? ? ?ED  Results / Procedures / Treatments  ? ?Labs ?(all labs ordered are listed, but only abnormal results are displayed) ?Labs Reviewed  ?COMPREHENSIVE METABOLIC PANEL - Abnormal; Notable for the following components:  ?    Result Value  ? Glucose, Bld 108 (*)   ? Total Bilirubin 1.3 (*)   ? All other components within normal limits  ?URINALYSIS, ROUTINE W REFLEX MICROSCOPIC - Abnormal; Notable for the following components:  ? Color, Urine YELLOW (*)   ? APPearance HAZY (*)   ? All other components within normal limits  ?LIPASE, BLOOD  ?CBC  ?TYPE AND SCREEN  ? ? ? ?EKG ? ? ? ? ?RADIOLOGY ? ? ? ?PROCEDURES: ? ?Critical Care performed:  ? ?Procedures ? ? ?MEDICATIONS ORDERED IN ED: ?Medications  ?dicyclomine (BENTYL) capsule 10 mg (has no administration in time range)  ?alum & mag hydroxide-simeth (MAALOX/MYLANTA) 200-200-20 MG/5ML suspension 30 mL (30 mLs Oral Given 06/09/21 1845)  ?  And  ?lidocaine (XYLOCAINE) 2 % viscous mouth solution 15 mL (15 mLs Oral Given 06/09/21 1846)  ? ? ? ?IMPRESSION / MDM / ASSESSMENT AND PLAN / ED COURSE  ?I reviewed  the triage vital signs and the nursing notes. ?             ?               ? ?Differential diagnosis includes, but is not limited to, gastritis, enteritis, diverticulitis, appendicitis, SBO, hernia, PUD, LGIB ? ?Patient presenting to the ER for symptoms as described above hemodynamically stable well-appearing blood work sent for the blood differential was reassuring.  His abdominal exam is soft and benign.  No bleeding on DRE no hemorrhoid.  Suspect persistent gastritis.  Do not feel that repeat CT imaging is clinically indicated.  Do believe he stable and appropriate for further work-up as an outpatient.  Patient agrees to plan. ? ? ?  ? ? ?FINAL CLINICAL IMPRESSION(S) / ED DIAGNOSES  ? ?Final diagnoses:  ?Epigastric pain  ? ? ? ?Rx / DC Orders  ? ?ED Discharge Orders   ? ?      Ordered  ?  dicyclomine (BENTYL) 10 MG capsule  Every 8 hours PRN       ? 06/09/21 1858  ? ?  ?  ? ?   ? ? ? ?Note:  This document was prepared using Dragon voice recognition software and may include unintentional dictation errors. ? ?  ?Willy Eddy, MD ?06/09/21 1858 ? ?

## 2021-06-09 NOTE — ED Notes (Signed)
Provider at bedside for DRE. ?

## 2023-02-22 IMAGING — CT CT ABD-PELV W/ CM
2 of 5 series · 15 of 46 positions shown, 17 images · IV contrast (APPLIED)
Comparison: Limited CT images for joint injection from 9276.

CLINICAL DATA: A 40-year-old male presents with history of
abdominal pain, pain also in the lower back. Symptoms began 2 months
ago.

EXAM:
CT ABDOMEN AND PELVIS WITH CONTRAST
TECHNIQUE: Multidetector CT imaging of the abdomen and pelvis was performed
using the standard protocol following bolus administration of
intravenous contrast.

[Series 2: abdomen 5.0 · axial · 0.71mm/px · z∈[-1037,-612]mm · 12 of 99 slices shown, 14 images]
[im 7/99  soft-tissue]
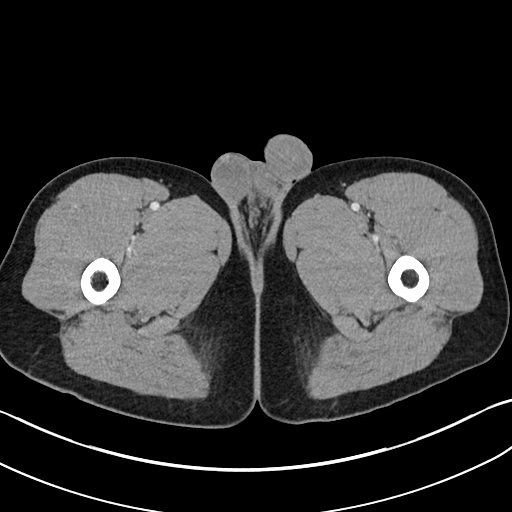
[im 7/99  bone]
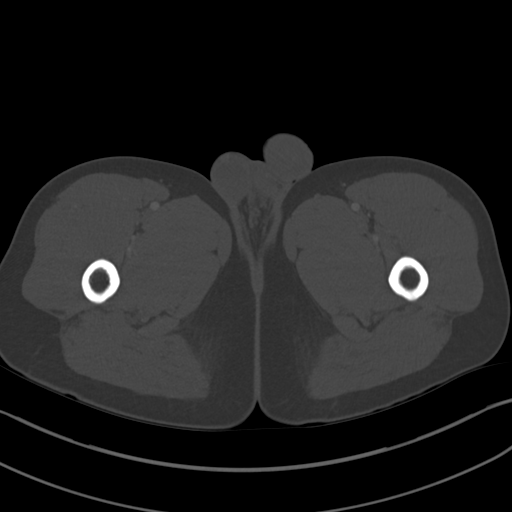
[im 14/99  soft-tissue]
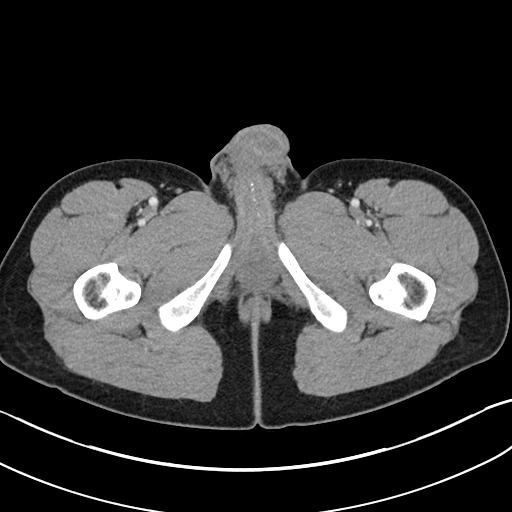
[im 20/99  soft-tissue]
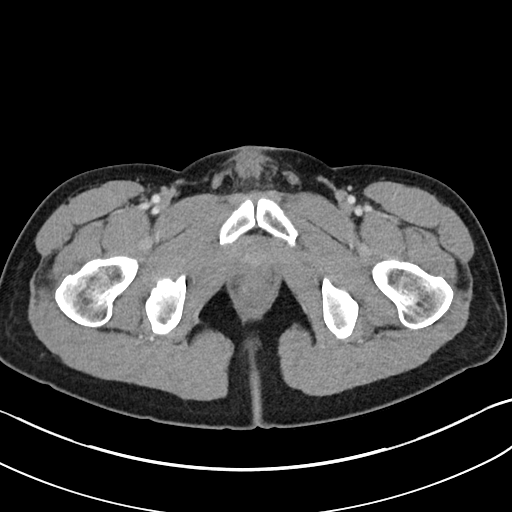
[im 33/99  soft-tissue]
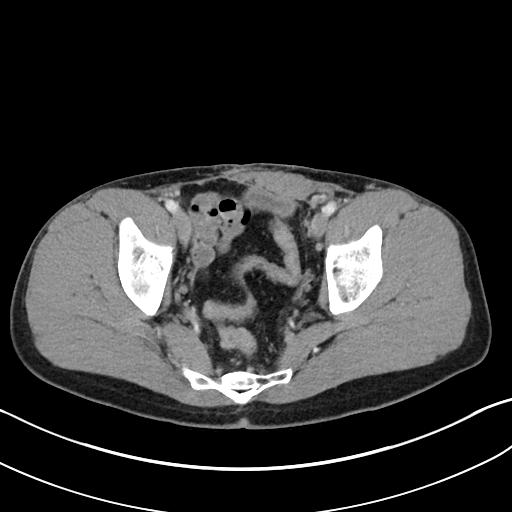
[im 40/99  soft-tissue]
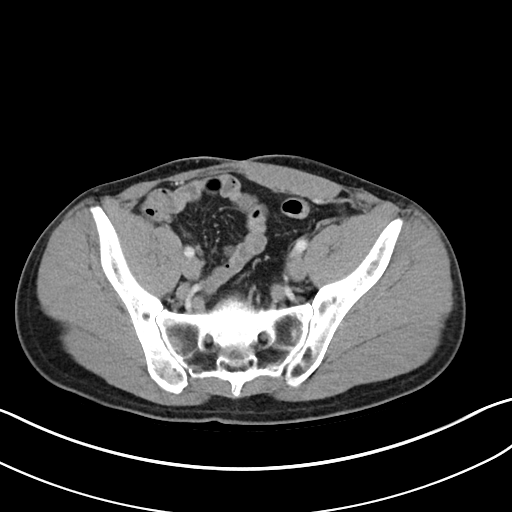
[im 46/99  soft-tissue]
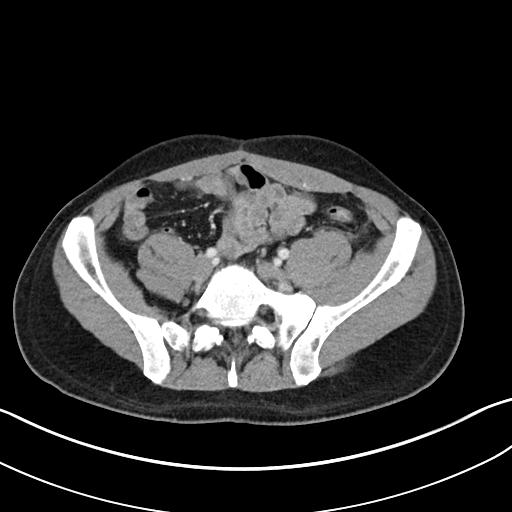
[im 53/99  soft-tissue]
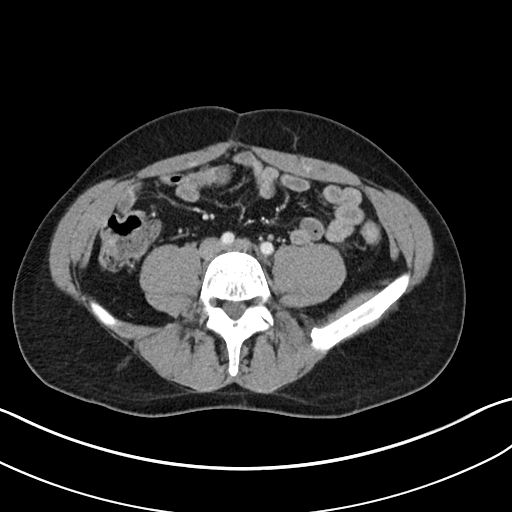
[im 59/99  soft-tissue]
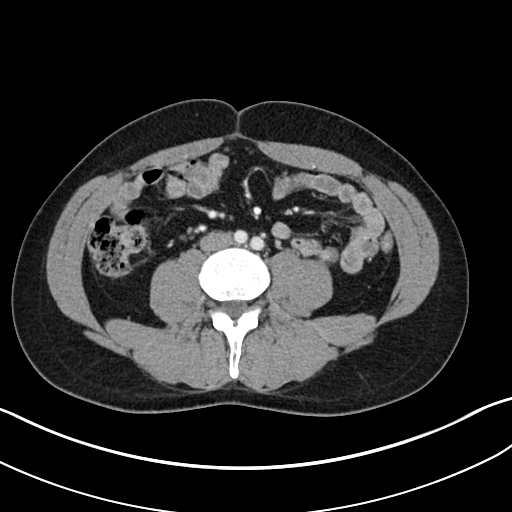
[im 66/99  soft-tissue]
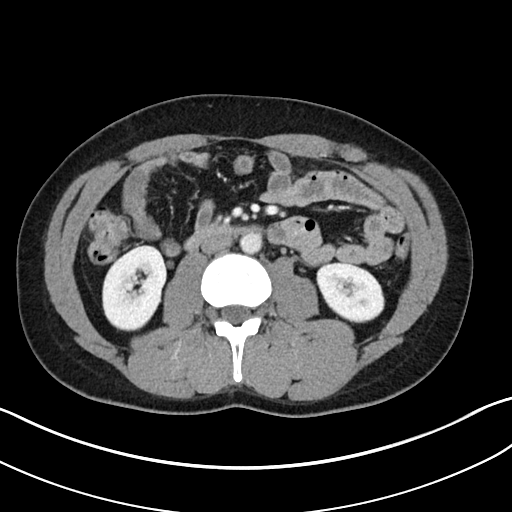
[im 66/99  bone]
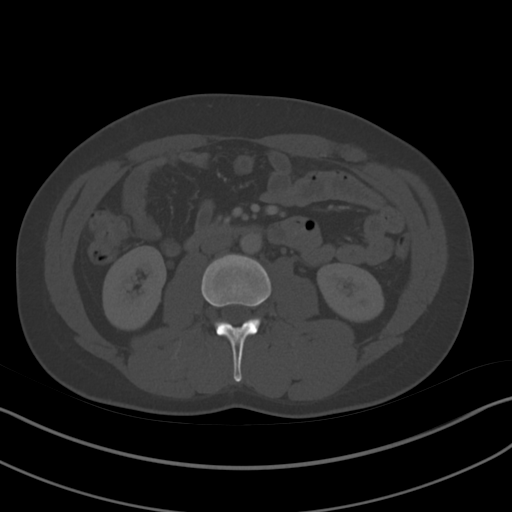
[im 79/99  soft-tissue]
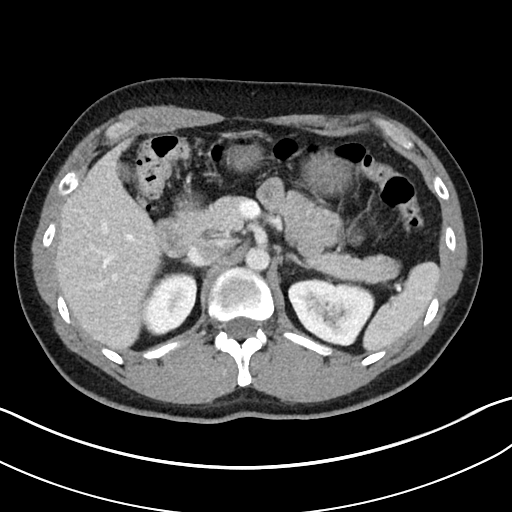
[im 85/99  soft-tissue]
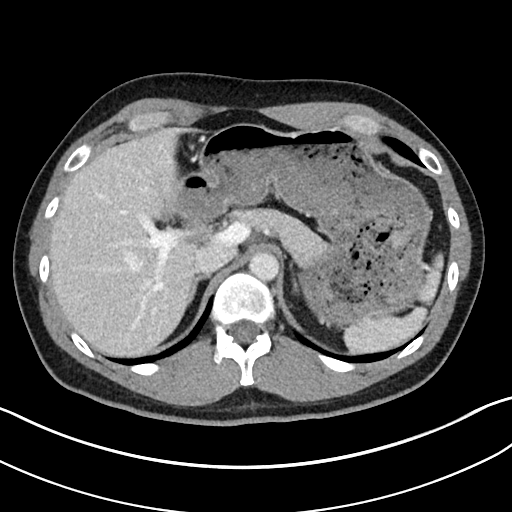
[im 92/99  soft-tissue]
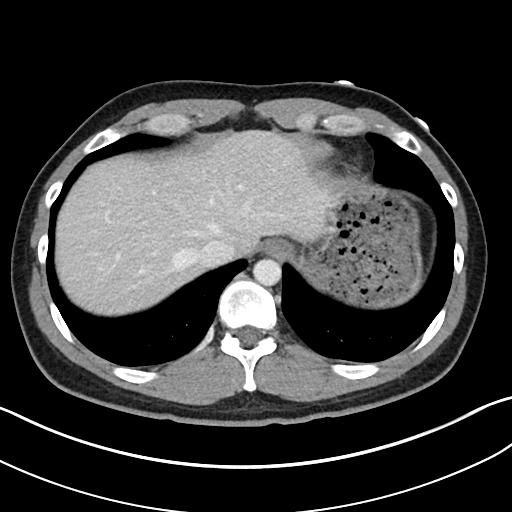

[Series 5: abdomen 3.0 mpr cor · coronal · 0.74mm/px · 3 of 87 slices shown]
[im 29/87  soft-tissue]
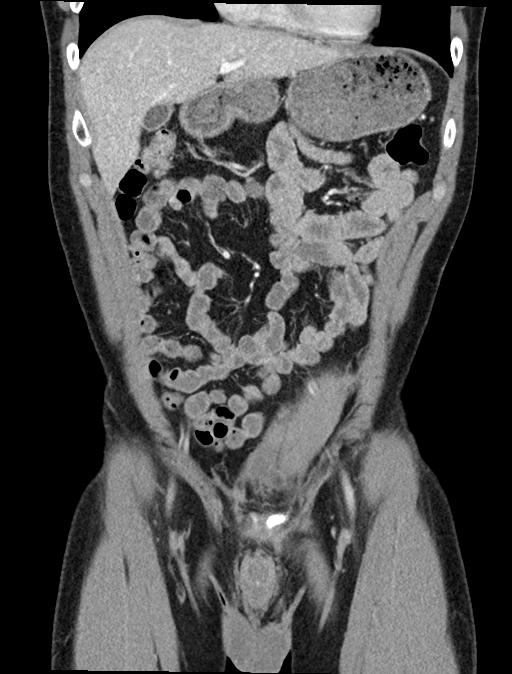
[im 39/87  soft-tissue]
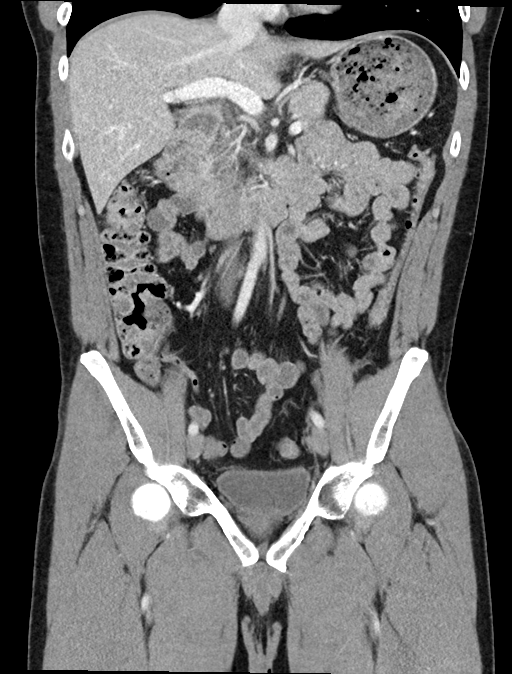
[im 48/87  soft-tissue]
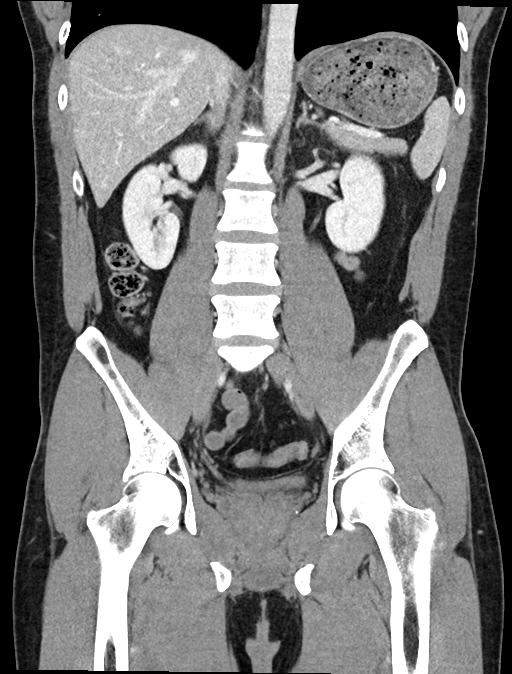

[15 of 46 positions shown; findings below may reference images not displayed]

RADIATION DOSE REDUCTION: This exam was performed according to the
departmental dose-optimization program which includes automated
exposure control, adjustment of the mA and/or kV according to
patient size and/or use of iterative reconstruction technique.

CONTRAST:  80mL OMNIPAQUE IOHEXOL 300 MG/ML  SOLN
FINDINGS: Lower chest: Lung bases without signs of effusion or evidence of
consolidation. Granuloma in the LEFT lung base.

Hepatobiliary: No focal, suspicious hepatic lesion. No
pericholecystic stranding. No biliary duct dilation. Portal vein is
patent.

Pancreas: Normal, without mass, inflammation or ductal dilatation.

Spleen: Spleen is normal.

Adrenals/Urinary Tract:

Adrenal glands are unremarkable. Symmetric renal enhancement. No
sign of hydronephrosis. No suspicious renal lesion or perinephric
stranding.

.Close apposition of the urinary bladder to the LEFT abdominal wall
is similar to imaging dating back to 9276. No perivesical stranding.
Thinning of RIGHT rectus muscle and disturbance in adjacent soft
tissues about the urinary bladder compatible with prior surgery.

Stomach/Bowel: Moderately distended stomach filled with ingested
contents. No signs of adjacent stranding. Small bowel without signs
of obstruction or inflammation. The appendix is normal. Stool
throughout the colon.

Vascular/Lymphatic:

Aorta with smooth contours. IVC with smooth contours. No aneurysmal
dilation of the abdominal aorta. There is no gastrohepatic or
hepatoduodenal ligament lymphadenopathy. No retroperitoneal or
mesenteric lymphadenopathy.

No pelvic sidewall lymphadenopathy.

Reproductive: Unremarkable by CT.

Other: No ascites.  No free intra-abdominal air.

Musculoskeletal: No acute bone finding. No destructive bone process.
Spinal degenerative changes. Degenerative changes are mild.
IMPRESSION: 1. No acute findings in the abdomen or in the pelvis.
2. Postoperative changes in the suprapubic region perhaps related to
prior herniorrhaphy with thinning of RIGHT rectus muscle and close
apposition of the urinary bladder to the under surface of the LEFT
rectus muscle. No surrounding stranding beyond what was present in
